# Patient Record
Sex: Female | Born: 2005
Health system: Southern US, Community
[De-identification: ages and names within clinical notes are randomized; demographics above are authoritative.]

## PROBLEM LIST (undated history)

## (undated) DIAGNOSIS — H9325 Central auditory processing disorder: Secondary | ICD-10-CM

## (undated) DIAGNOSIS — R29898 Other symptoms and signs involving the musculoskeletal system: Secondary | ICD-10-CM

## (undated) DIAGNOSIS — F909 Attention-deficit hyperactivity disorder, unspecified type: Secondary | ICD-10-CM

## (undated) DIAGNOSIS — R6889 Other general symptoms and signs: Secondary | ICD-10-CM

## (undated) DIAGNOSIS — F840 Autistic disorder: Secondary | ICD-10-CM

## (undated) DIAGNOSIS — J353 Hypertrophy of tonsils with hypertrophy of adenoids: Secondary | ICD-10-CM

## (undated) DIAGNOSIS — K219 Gastro-esophageal reflux disease without esophagitis: Secondary | ICD-10-CM

## (undated) DIAGNOSIS — F82 Specific developmental disorder of motor function: Secondary | ICD-10-CM

## (undated) DIAGNOSIS — J302 Other seasonal allergic rhinitis: Secondary | ICD-10-CM

## (undated) DIAGNOSIS — R252 Cramp and spasm: Secondary | ICD-10-CM

## (undated) DIAGNOSIS — F88 Other disorders of psychological development: Secondary | ICD-10-CM

## (undated) DIAGNOSIS — F32A Depression, unspecified: Secondary | ICD-10-CM

## (undated) DIAGNOSIS — F419 Anxiety disorder, unspecified: Secondary | ICD-10-CM

## (undated) DIAGNOSIS — M6289 Other specified disorders of muscle: Secondary | ICD-10-CM

## (undated) HISTORY — DX: Other symptoms and signs involving the musculoskeletal system: R29.898

## (undated) HISTORY — DX: Gastro-esophageal reflux disease without esophagitis: K21.9

## (undated) HISTORY — DX: Depression, unspecified: F32.A

## (undated) HISTORY — DX: Other specified disorders of muscle: M62.89

## (undated) HISTORY — DX: Anxiety disorder, unspecified: F41.9

## (undated) HISTORY — DX: Attention-deficit hyperactivity disorder, unspecified type: F90.9

---
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
## 2023-12-16 ENCOUNTER — Encounter: Payer: Self-pay | Admitting: Family Medicine

## 2023-12-16 ENCOUNTER — Ambulatory Visit: Payer: 59 | Admitting: Family Medicine

## 2023-12-16 VITALS — BP 110/62 | HR 87 | Temp 98.3°F | Wt 126.4 lb

## 2023-12-16 DIAGNOSIS — K2100 Gastro-esophageal reflux disease with esophagitis, without bleeding: Secondary | ICD-10-CM

## 2023-12-16 DIAGNOSIS — F32A Depression, unspecified: Secondary | ICD-10-CM | POA: Insufficient documentation

## 2023-12-16 DIAGNOSIS — F419 Anxiety disorder, unspecified: Secondary | ICD-10-CM

## 2023-12-16 DIAGNOSIS — F900 Attention-deficit hyperactivity disorder, predominantly inattentive type: Secondary | ICD-10-CM | POA: Diagnosis not present

## 2023-12-16 MED ORDER — METHYLPHENIDATE HCL ER (OSM) 18 MG PO TBCR: 18.0000 mg | EXTENDED_RELEASE_TABLET | Freq: Every day | ORAL | 0 refills | Status: DC

## 2023-12-16 NOTE — Progress Notes (Signed)
   Subjective:    Patient ID: Kathryn Everett, female    DOB: 16-Jan-2006, 18 y.o.   MRN: [id number]  HPI New to establish.  Previous MD- [facility]  GERD- pt has hx of this and was previously on Nexium.  Sxs have since resolved.  Not currently on medication.  Anxiety/Depression- pt feels sxs are more depression than anxiety.  Pt reports sxs have worsened in the past few months after having a bad first semester.  Pt had a difficult time remembering what she read, had a hard time organizing tasks.  Pt does carry a dx of ADD but has not been medicated for this.     Review of Systems For ROS see HPI     Objective:   Physical Exam Vitals reviewed.  Constitutional:      General: She is not in acute distress.    Appearance: Normal appearance. She is well-developed. She is not ill-appearing.  HENT:     Head: Normocephalic and atraumatic.  Eyes:     Conjunctiva/sclera: Conjunctivae normal.     Pupils: Pupils are equal, round, and reactive to light.  Neck:     Thyroid: No thyromegaly.  Cardiovascular:     Rate and Rhythm: Normal rate and regular rhythm.     Heart sounds: Normal heart sounds. No murmur heard. Pulmonary:     Effort: Pulmonary effort is normal. No respiratory distress.     Breath sounds: Normal breath sounds.  Abdominal:     General: There is no distension.     Palpations: Abdomen is soft.     Tenderness: There is no abdominal tenderness.  Musculoskeletal:     Cervical back: Normal range of motion and neck supple.  Lymphadenopathy:     Cervical: No cervical adenopathy.  Skin:    General: Skin is warm and dry.  Neurological:     Mental Status: She is alert and oriented to person, place, and time.  Psychiatric:        Behavior: Behavior normal.           Assessment & Plan:

## 2023-12-16 NOTE — Patient Instructions (Signed)
Follow up in 3 weeks to recheck ADD and mood START the Concerta (methylphenidate) daily in the morning It can be normal to feel jittery when you first start taking this medication and should improve in a few days Make sure you are eating regularly Call with any questions or concerns Welcome!  We're glad to have you!

## 2023-12-19 NOTE — Assessment & Plan Note (Signed)
New to provider, ongoing for pt but recently worse.  She is not currently on medication.  She struggled with her first semester at college and this caused her anxiety to spike and then worsened her depression as she got bad grades.  We had a long discussion- pt, mom, and I- that anxiety/depression often go hand in hand w/ ADHD.  She has a dx of ADHD but has never been on medication as she was able to manage prior to college.  During our discussion, pt and mom agree that we should start w/ treatment of ADHD and we can revisit her anxiety and depression if sxs don't improve.  Pt expressed understanding and is in agreement w/ plan.

## 2023-12-19 NOTE — Assessment & Plan Note (Signed)
New to provider, ongoing for pt.  She was previously on Nexium but feels that sxs have improved w/ time.  No longer on medication but was told to let me know if sxs return.  Pt expressed understanding and is in agreement w/ plan.

## 2023-12-19 NOTE — Assessment & Plan Note (Signed)
New to provider, ongoing for pt.  She carries a formal dx but has never been on medication.  Pt and mom are in agreement that after her struggles last semester, she is ready to start medication.  We talked about various types of ADHD meds- stimulants vs non stimulants- and after talking about appropriate use, possible side effects we will start Concerta.  We will follow this closely and adjust meds prn.

## 2023-12-29 ENCOUNTER — Encounter: Payer: Self-pay | Admitting: Family Medicine

## 2024-01-06 ENCOUNTER — Encounter: Payer: Self-pay | Admitting: Family Medicine

## 2024-01-06 ENCOUNTER — Ambulatory Visit: Payer: 59 | Admitting: Family Medicine

## 2024-01-06 ENCOUNTER — Telehealth: Payer: Self-pay | Admitting: Family Medicine

## 2024-01-06 VITALS — BP 98/62 | HR 82 | Temp 98.6°F | Ht 60.0 in | Wt 127.5 lb

## 2024-01-06 DIAGNOSIS — F900 Attention-deficit hyperactivity disorder, predominantly inattentive type: Secondary | ICD-10-CM | POA: Diagnosis not present

## 2024-01-06 MED ORDER — LISDEXAMFETAMINE DIMESYLATE 30 MG PO CAPS
30.0000 mg | ORAL_CAPSULE | Freq: Every day | ORAL | 0 refills | Status: AC
Start: 2024-01-06 — End: ?

## 2024-01-06 NOTE — Assessment & Plan Note (Signed)
Unchanged.  Unfortunately Concerta caused HA's and pt wasn't able to tolerate the medication.  She did find that it was easier to accomplish things on her to-do list but still struggled w/ reading.  Will switch to Vyvanse and see if she is able to control her sxs w/o side effects.  Pt expressed understanding and is in agreement w/ plan.

## 2024-01-06 NOTE — Telephone Encounter (Signed)
Pt's mother states her FSA card is at home and she will pay on her [place] balance when she get home.

## 2024-01-06 NOTE — Progress Notes (Signed)
   Subjective:    Patient ID: Kathryn Everett, female    DOB: 09-03-2006, 18 y.o.   MRN: [id number]  HPI ADHD- at last visit we started Concerta 18mg  daily.  She took this for 1 week and then stopped due to HA's and the way it made her feel.  HA would occur later in the day as the medication wore off.  Pt reports she saw some improvement in her ability to get thru the to-do list but no improvement in her ability to read and retain.  Denies palpitations.  Some improvement in anxiety.     Review of Systems For ROS see HPI     Objective:   Physical Exam Vitals reviewed.  Constitutional:      General: She is not in acute distress.    Appearance: Normal appearance. She is not ill-appearing.  HENT:     Head: Normocephalic and atraumatic.  Eyes:     Extraocular Movements: Extraocular movements intact.     Conjunctiva/sclera: Conjunctivae normal.  Cardiovascular:     Rate and Rhythm: Normal rate.  Pulmonary:     Effort: Pulmonary effort is normal.  Skin:    General: Skin is warm and dry.  Neurological:     General: No focal deficit present.     Mental Status: She is alert and oriented to person, place, and time.  Psychiatric:        Mood and Affect: Mood normal.        Thought Content: Thought content normal.           Assessment & Plan:

## 2024-01-06 NOTE — Patient Instructions (Signed)
Follow up in 4 weeks to recheck ADHD START the Vyvanse daily Monitor for side effects and improvement Call with any questions or concerns HAPPY BIRTHDAY!!!

## 2024-02-03 ENCOUNTER — Ambulatory Visit: Payer: Self-pay | Admitting: Family Medicine
